# Patient Record
Sex: Female | Born: 1980 | Race: Black or African American | Hispanic: No | Marital: Married | Smoking: Never smoker
Health system: Southern US, Community
[De-identification: ages and names within clinical notes are randomized; demographics above are authoritative.]

## PROBLEM LIST (undated history)

## (undated) DIAGNOSIS — E669 Obesity, unspecified: Secondary | ICD-10-CM

## (undated) DIAGNOSIS — I10 Essential (primary) hypertension: Secondary | ICD-10-CM

---

## 2006-06-01 ENCOUNTER — Emergency Department: Payer: Self-pay

## 2009-11-24 ENCOUNTER — Emergency Department: Payer: Self-pay | Admitting: Emergency Medicine

## 2011-03-16 DIAGNOSIS — M722 Plantar fascial fibromatosis: Secondary | ICD-10-CM | POA: Insufficient documentation

## 2011-03-16 DIAGNOSIS — I10 Essential (primary) hypertension: Secondary | ICD-10-CM | POA: Insufficient documentation

## 2011-04-11 ENCOUNTER — Ambulatory Visit: Payer: Self-pay | Admitting: Family Medicine

## 2011-08-19 ENCOUNTER — Emergency Department: Payer: Self-pay | Admitting: Unknown Physician Specialty

## 2011-08-19 LAB — CBC WITH DIFFERENTIAL/PLATELET
Basophil #: 0 10*3/uL (ref 0.0–0.1)
Basophil %: 0.4 %
Eosinophil %: 0.2 %
HCT: 39.7 % (ref 35.0–47.0)
HGB: 13.2 g/dL (ref 12.0–16.0)
Lymphocyte #: 2.1 10*3/uL (ref 1.0–3.6)
Lymphocyte %: 23.9 %
Monocyte %: 5 %
Neutrophil %: 70.5 %
RDW: 15 % — ABNORMAL HIGH (ref 11.5–14.5)
WBC: 8.7 10*3/uL (ref 3.6–11.0)

## 2011-08-19 LAB — URINALYSIS, COMPLETE
Nitrite: NEGATIVE
Protein: 30
Specific Gravity: 1.03 (ref 1.003–1.030)

## 2011-08-20 LAB — PREGNANCY, URINE: Pregnancy Test, Urine: NEGATIVE m[IU]/mL

## 2012-12-07 DIAGNOSIS — Z6841 Body Mass Index (BMI) 40.0 and over, adult: Secondary | ICD-10-CM | POA: Insufficient documentation

## 2015-05-06 ENCOUNTER — Ambulatory Visit: Admission: EM | Admit: 2015-05-06 | Discharge: 2015-05-06 | Discharge status: Absent without leave | Payer: Self-pay

## 2015-08-13 ENCOUNTER — Ambulatory Visit
Admission: EM | Admit: 2015-08-13 | Discharge: 2015-08-13 | Disposition: A | Discharge status: Home / Self Care | Payer: Federal, State, Local not specified - PPO | Attending: Family Medicine | Admitting: Family Medicine

## 2015-08-13 DIAGNOSIS — H109 Unspecified conjunctivitis: Secondary | ICD-10-CM

## 2015-08-13 HISTORY — DX: Obesity, unspecified: E66.9

## 2015-08-13 MED ORDER — MOXIFLOXACIN HCL 0.5 % OP SOLN: 1.0000 [drp] | Freq: Three times a day (TID) | OPHTHALMIC | Status: AC

## 2015-08-13 NOTE — ED Provider Notes (Signed)
CSN: [id number]     Arrival date & time 08/13/15  1443 History   First MD Initiated Contact with Patient 08/13/15 1614     Chief Complaint  Patient presents with  . Eye Problem   (Consider location/radiation/quality/duration/timing/severity/associated sxs/prior Treatment) HPI Comments: 34 yo female with a 2 days h/o right eye drainage and redness. This morning woke up with more drainage and eyes crusted shut. Denies any trauma, injury, foreign body in eye, vision changes, fevers. Does not wear contacts.   The history is provided by the patient.    Past Medical History  Diagnosis Date  . Obesity    History reviewed. No pertinent past surgical history. Family History  Problem Relation Age of Onset  . Diabetes Father    Social History  Substance Use Topics  . Smoking status: Never Smoker   . Smokeless tobacco: None  . Alcohol Use: No   OB History    No data available     Review of Systems  Allergies  Review of patient's allergies indicates no known allergies.  Home Medications   Prior to Admission medications   Medication Sig Start Date End Date Taking? Authorizing Provider  moxifloxacin (VIGAMOX) 0.5 % ophthalmic solution Place 1 drop into the right eye 3 (three) times daily. For 5 days 08/13/15   Payton Mccallumrlando Cordarrel Stiefel, MD   Meds Ordered and Administered this Visit  Medications - No data to display  BP 127/85 mmHg  Pulse 90  Temp(Src) 97.9 F (36.6 C) (Oral)  Resp 16  Ht 5\' 7"  (1.702 m)  Wt 330 lb (149.687 kg)  BMI 51.67 kg/m2  SpO2 100%  LMP 08/06/2015 (Approximate) No data found.   Physical Exam  Constitutional: She appears well-developed and well-nourished. No distress.  Eyes: EOM are normal. Pupils are equal, round, and reactive to light. Right eye exhibits discharge (mild drainage). Left eye exhibits no discharge. Right conjunctiva is injected. Right conjunctiva has no hemorrhage. Left conjunctiva is not injected. Left conjunctiva has no hemorrhage. No scleral  icterus.  Skin: She is not diaphoretic.  Nursing note and vitals reviewed.   ED Course  Procedures (including critical care time)  Labs Review Labs Reviewed - No data to display  Imaging Review No results found.   Visual Acuity Review  Right Eye Distance: 20/25 corrected Left Eye Distance: 20/25 corrected Bilateral Distance:    Right Eye Near:   Left Eye Near:    Bilateral Near:         MDM   1. Conjunctivitis, right eye    Discharge Medication List as of 08/13/2015  4:26 PM    START taking these medications   Details  moxifloxacin (VIGAMOX) 0.5 % ophthalmic solution Place 1 drop into the right eye 3 (three) times daily. For 5 days, Starting 08/13/2015, Until Discontinued, Normal       1. diagnosis reviewed with patient 2. rx as per orders above; reviewed possible side effects, interactions, risks and benefits  3. Recommend supportive treatment with cool, moist compresses to right eye 4. Follow-up prn if symptoms worsen or don't improve    Payton Mccallumrlando Saniyya Gau, MD 08/13/15 1630

## 2015-08-13 NOTE — ED Notes (Signed)
Started yesterday with itching red eye. Crusted shut this morning

## 2016-06-12 ENCOUNTER — Ambulatory Visit
Admission: EM | Admit: 2016-06-12 | Discharge: 2016-06-12 | Disposition: A | Discharge status: Home / Self Care | Payer: Federal, State, Local not specified - PPO | Attending: Family Medicine | Admitting: Family Medicine

## 2016-06-12 ENCOUNTER — Ambulatory Visit (INDEPENDENT_AMBULATORY_CARE_PROVIDER_SITE_OTHER): Payer: Federal, State, Local not specified - PPO

## 2016-06-12 DIAGNOSIS — M25561 Pain in right knee: Secondary | ICD-10-CM

## 2016-06-12 DIAGNOSIS — S8992XA Unspecified injury of left lower leg, initial encounter: Secondary | ICD-10-CM

## 2016-06-12 HISTORY — DX: Essential (primary) hypertension: I10

## 2016-06-12 MED ORDER — OXYCODONE-ACETAMINOPHEN 5-325 MG PO TABS: 1.0000 | ORAL_TABLET | Freq: Three times a day (TID) | ORAL | 0 refills | Status: AC | PRN

## 2016-06-12 MED ORDER — KETOROLAC TROMETHAMINE 60 MG/2ML IM SOLN
60.0000 mg | Freq: Once | INTRAMUSCULAR | Status: AC
  Administered 2016-06-12: 60 mg via INTRAMUSCULAR

## 2016-06-12 MED ORDER — MELOXICAM 15 MG PO TABS: 15.0000 mg | ORAL_TABLET | Freq: Every day | ORAL | 0 refills | Status: DC

## 2016-06-12 NOTE — ED Provider Notes (Signed)
[facility]    CSN: [id number] Arrival date & time: 06/12/16  1437     History   Chief Complaint Chief Complaint  Patient presents with  . Knee Pain    right knee throbbing pain hard ROM onset yesterday pt has hx of knee pain and ankle pain 6 month to year but getting worst yesterday    HPI Guinevere ScarletJerica M Tyer is a 35 y.o. female.   Patient is a 35 year old black female. States that she's on her knees and feet all day long and she has knee and feet problems anyway because of her weight but yesterday while she was at school started having specific pain in her right knee. She denies any injury a twisting but noticed the discomfort continued to get worse in the right knee. She did not feel a pop nor anything hit the knee. She's never had any diagnostic tests done on her. She has a history of hypertension and obesity. She does not smoke and no known drug allergies. Mother and father have diabetes. No previous surgeries or operations   The history is provided by the patient. No language interpreter was used.  Knee Pain  Location:  Knee Injury: no   Knee location:  R knee Pain details:    Quality:  Tearing and shooting   Severity:  Severe   Onset quality:  Sudden   Timing:  Constant   Progression:  Unchanged Chronicity:  New Dislocation: no   Foreign body present:  No foreign bodies Relieved by:  Nothing Worsened by:  Nothing Ineffective treatments:  None tried   Past Medical History:  Diagnosis Date  . Hypertension   . Obesity     There are no active problems to display for this patient.   History reviewed. No pertinent surgical history.  OB History    No data available       Home Medications    Prior to Admission medications   Medication Sig Start Date End Date Taking? Authorizing Provider  moxifloxacin (VIGAMOX) 0.5 % ophthalmic solution Place 1 drop into the right eye 3 (three) times daily. For 5 days 08/13/15   Payton Mccallumrlando Conty, MD    oxyCODONE-acetaminophen (PERCOCET/ROXICET) 5-325 MG tablet Take 1 tablet by mouth every 8 (eight) hours as needed for moderate pain or severe pain. May cause sedation recommend mainly taking at night 06/12/16   Hassan RowanEugene Emmelia Holdsworth, MD    Family History Family History  Problem Relation Age of Onset  . Diabetes Father     Social History Social History  Substance Use Topics  . Smoking status: Never Smoker  . Smokeless tobacco: Never Used  . Alcohol use No     Allergies   Review of patient's allergies indicates no known allergies.   Review of Systems Review of Systems  Musculoskeletal: Positive for gait problem and joint swelling.  All other systems reviewed and are negative.    Physical Exam Triage Vital Signs ED Triage Vitals  Enc Vitals Group     BP 06/12/16 1628 (!) 146/67     Pulse Rate 06/12/16 1628 89     Resp 06/12/16 1628 16     Temp 06/12/16 1628 98.8 F (37.1 C)     Temp Source 06/12/16 1628 Oral     SpO2 06/12/16 1628 100 %     Weight 06/12/16 1631 (!) 320 lb (145.2 kg)     Height 06/12/16 1631 5\' 8"  (1.727 m)     Head Circumference --  Peak Flow --      Pain Score 06/12/16 1633 9     Pain Loc --      Pain Edu? --      Excl. in GC? --    No data found.   Updated Vital Signs BP (!) 146/67 (BP Location: Right Arm)   Pulse 89   Temp 98.8 F (37.1 C) (Oral)   Resp 16   Ht 5\' 8"  (1.727 m)   Wt (!) 320 lb (145.2 kg)   SpO2 100%   BMI 48.66 kg/m   Visual Acuity Right Eye Distance:   Left Eye Distance:   Bilateral Distance:    Right Eye Near:   Left Eye Near:    Bilateral Near:     Physical Exam  Constitutional: She is oriented to person, place, and time. Vital signs are normal. She appears well-developed and well-nourished. She is active.  Obese black female  HENT:  Head: Normocephalic.  Eyes: Conjunctivae are normal. Pupils are equal, round, and reactive to light.  Neck: Normal range of motion. Neck supple.  Pulmonary/Chest: Effort  normal.  Musculoskeletal: She exhibits edema and tenderness.       Right knee: She exhibits swelling and effusion. Tenderness found. Patellar tendon tenderness noted.       Legs: Patient is tender over the lateral upper right knee also tender over the patella area and along the medial superior aspect patella. This also significant amount of swelling in the right knee as well With the [facility] joint laxity though of the knee at this time.  Neurological: She is alert and oriented to person, place, and time. She has normal reflexes.  Skin: Skin is warm and dry.  Psychiatric: She has a normal mood and affect.  Vitals reviewed.    UC Treatments / Results  Labs (all labs ordered are listed, but only abnormal results are displayed) Labs Reviewed - No data to display  EKG  EKG Interpretation None       Radiology Dg Knee Complete 4 Views Right  Result Date: 06/12/2016 CLINICAL DATA:  Right knee pain for 1 day.  Unable to bear weight. EXAM: RIGHT KNEE - COMPLETE 4+ VIEW COMPARISON:  None. FINDINGS: Curvilinear osseous fragment at the upper margin of the medial femoral condyle suggesting avulsion of the medial femoral collateral ligament (Pellegrini-Stieda lesion). Osseous structures appear otherwise intact and normally aligned. Probable small joint effusion within the suprapatellar bursa. IMPRESSION: Curvilinear osseous fragment at the upper margin of the medial femoral condyle, suggesting avulsion of the medial femoral collateral ligament, of uncertain age. Electronically Signed   By: Bary RichardStan  Maynard M.D.   On: 06/12/2016 18:05    Procedures Procedures (including critical care time)  Medications Ordered in UC Medications  ketorolac (TORADOL) injection 60 mg (60 mg Intramuscular Given 06/12/16 1726)     Initial Impression / Assessment and Plan / UC Course  I have reviewed the triage vital signs and the nursing notes.  Pertinent labs & imaging results that were available during my care of  the patient were reviewed by me and considered in my medical decision making (see chart for details).  Clinical Course    Patient was given 60 Toradol for pain. X-ray did come back showing what appears be avulsion medial collateral ligament of the knee. Is also systems question of fluid on the knee as well.    Final Clinical Impressions(s) / UC Diagnoses   Final diagnoses:  Injury of ligament of left knee, initial encounter  Acute pain of right knee    New Prescriptions New Prescriptions   OXYCODONE-ACETAMINOPHEN (PERCOCET/ROXICET) 5-325 MG TABLET    Take 1 tablet by mouth every 8 (eight) hours as needed for moderate pain or severe pain. May cause sedation recommend mainly taking at night    Patient is brought in by her doctors are in [place] will recommend trying orthopedic and they do have an office here in [place] with PCP [facility] orthopedic will give her that phone number. She has trouble getting hold of both orthopedic on Monday to please call back. Work note given for Monday and Tuesday. Patient given a knee immobilizer and crutches. Also add a prescription of Percocet and Mobic as well.      Hassan Rowan, MD 06/12/16 [phone]

## 2016-06-25 DIAGNOSIS — M222X9 Patellofemoral disorders, unspecified knee: Secondary | ICD-10-CM | POA: Insufficient documentation

## 2016-06-25 DIAGNOSIS — M25569 Pain in unspecified knee: Secondary | ICD-10-CM | POA: Insufficient documentation

## 2017-10-14 DIAGNOSIS — Z803 Family history of malignant neoplasm of breast: Secondary | ICD-10-CM | POA: Insufficient documentation

## 2018-02-11 IMAGING — CR DG KNEE COMPLETE 4+V*R*
4 series · 4 of 4 positions shown · non-contrast
Comparison: None.

CLINICAL DATA: Right knee pain for 1 day.  Unable to bear weight.

EXAM:
RIGHT KNEE - COMPLETE 4+ VIEW

[knee ap (1 of 3)]
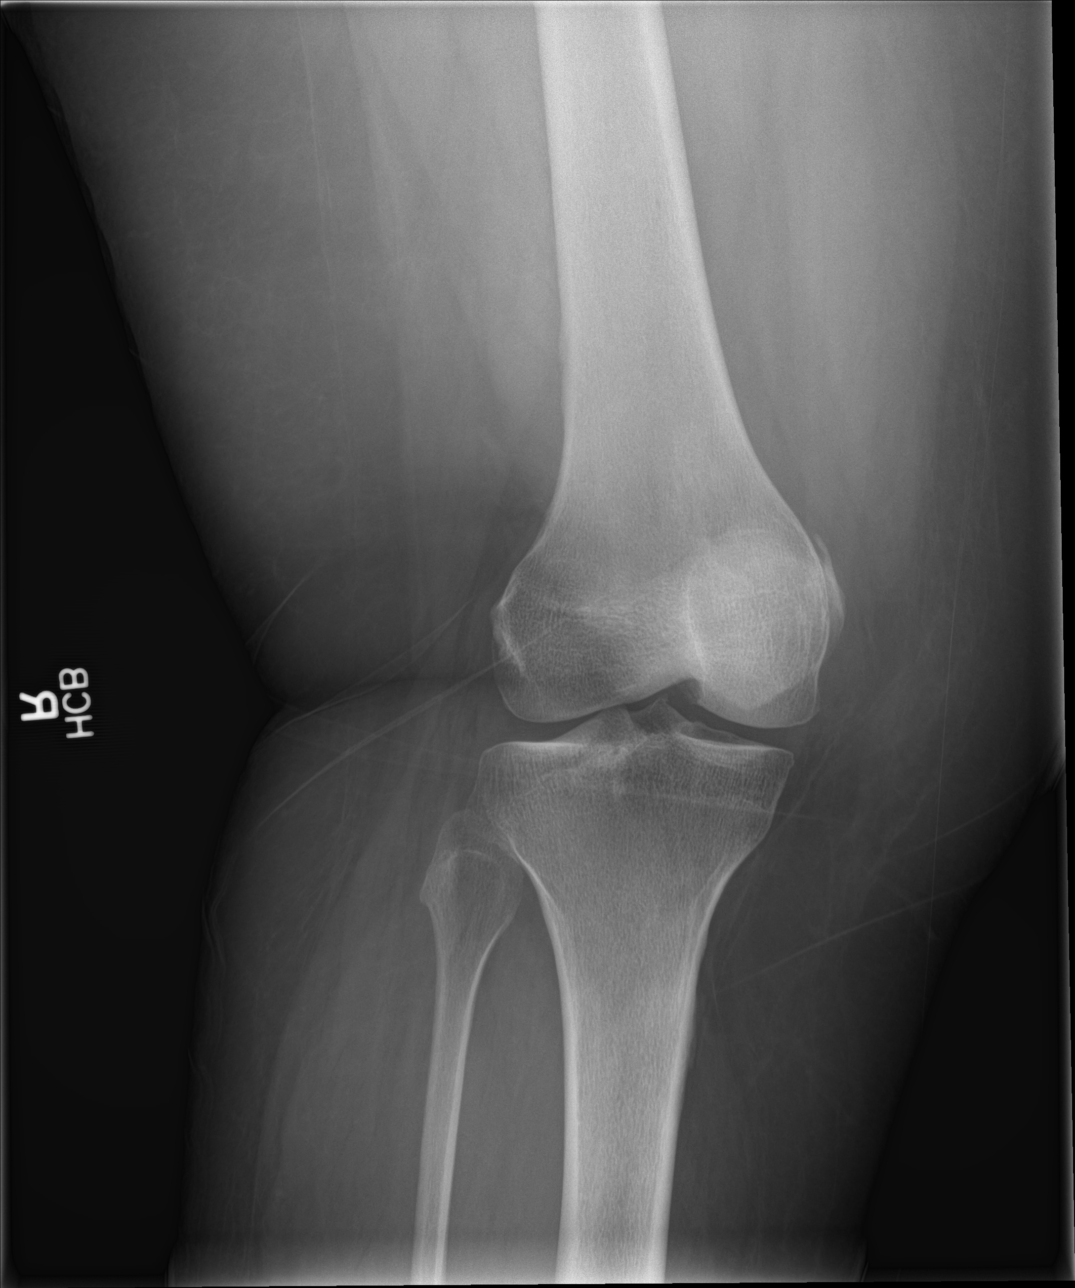

[knee lat]
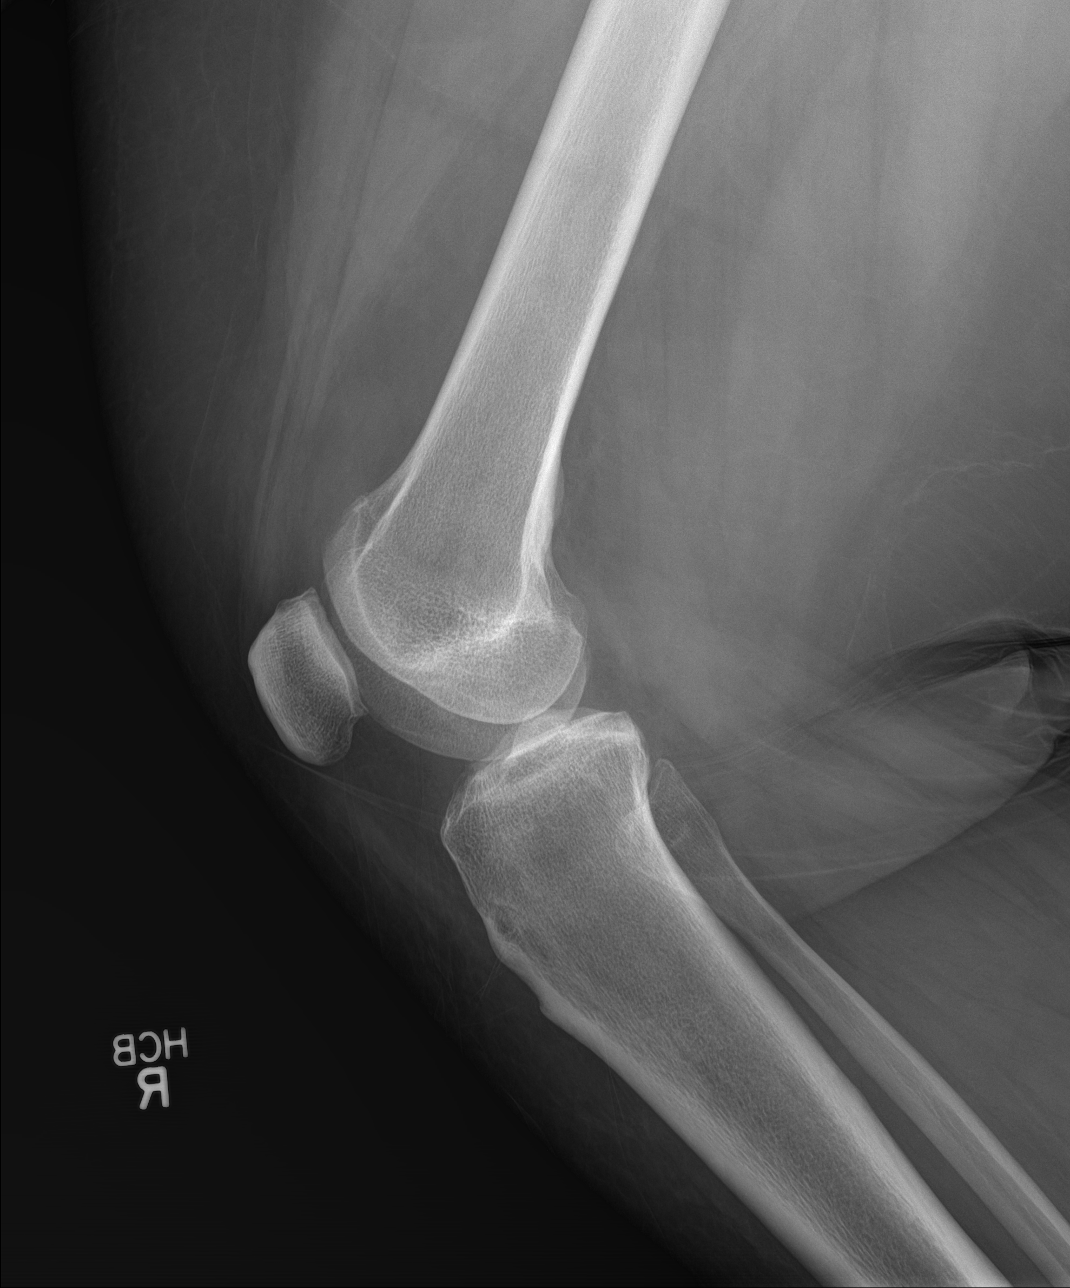

[knee ap (2 of 3)]
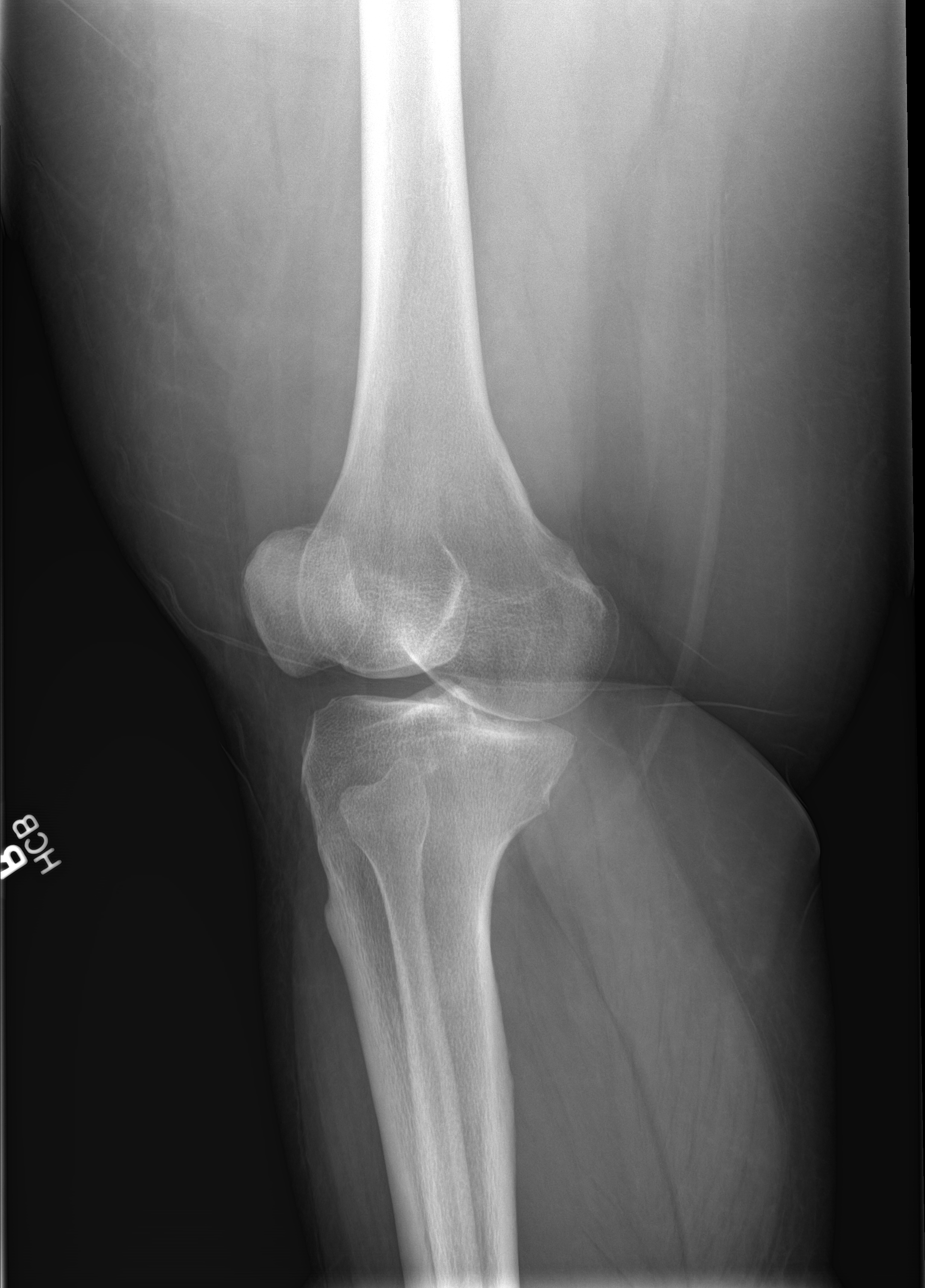

[knee ap (3 of 3)]
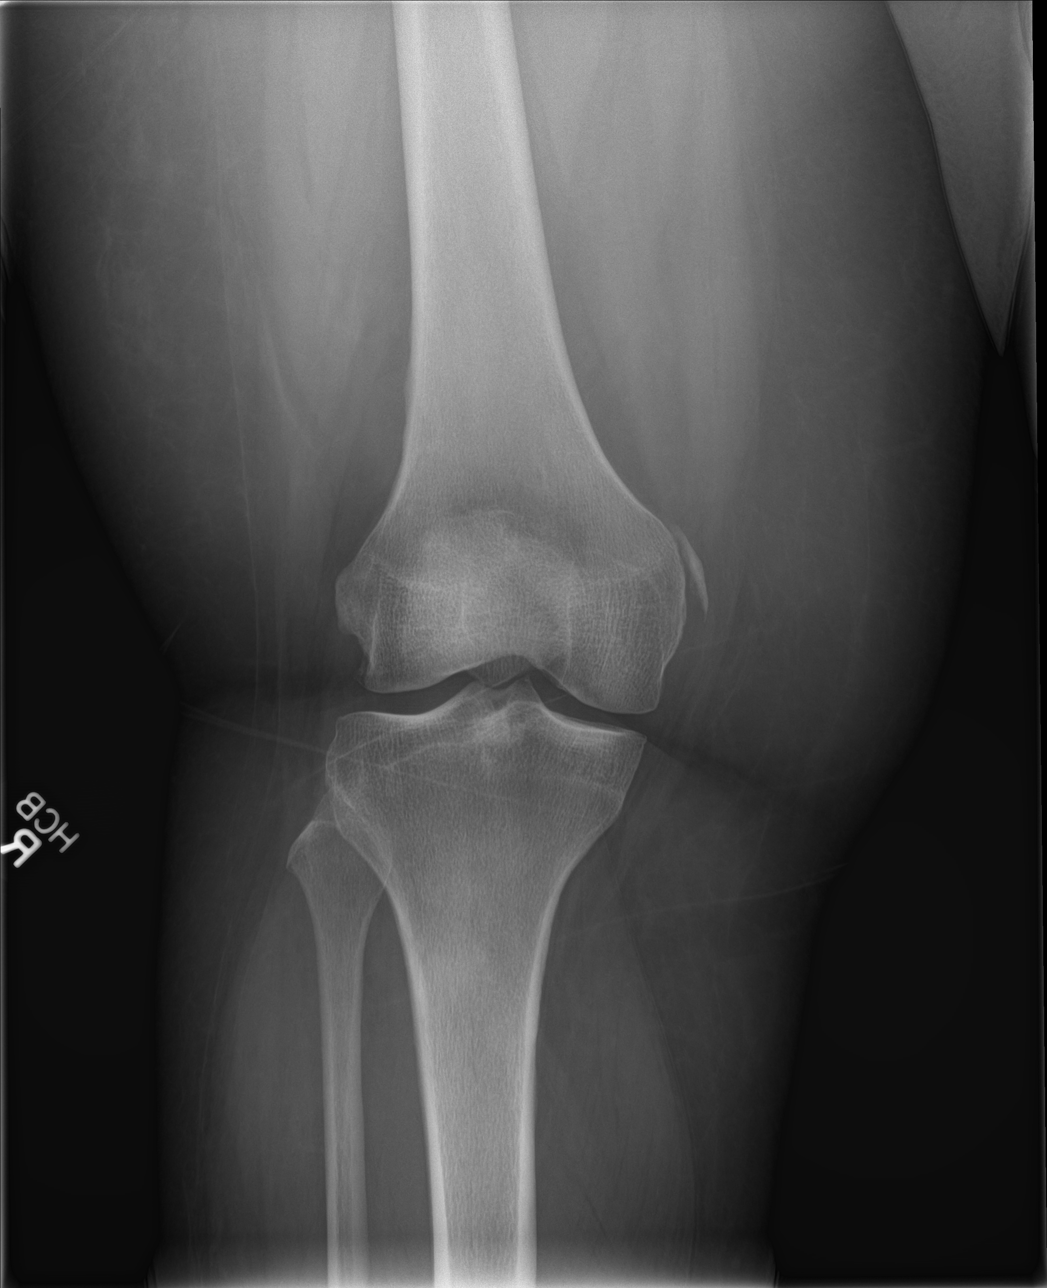

[4 of 4 positions shown; findings below may reference images not displayed]

FINDINGS: Curvilinear osseous fragment at the upper margin of the medial
femoral condyle suggesting avulsion of the medial femoral collateral
ligament (Pellegrini-Stieda lesion). Osseous structures appear
otherwise intact and normally aligned. Probable small joint effusion
within the suprapatellar bursa.
IMPRESSION: Curvilinear osseous fragment at the upper margin of the medial
femoral condyle, suggesting avulsion of the medial femoral
collateral ligament, of uncertain age.

## 2020-02-28 DIAGNOSIS — R7303 Prediabetes: Secondary | ICD-10-CM | POA: Insufficient documentation

## 2020-02-28 DIAGNOSIS — M7989 Other specified soft tissue disorders: Secondary | ICD-10-CM | POA: Insufficient documentation

## 2020-05-09 DIAGNOSIS — Z23 Encounter for immunization: Secondary | ICD-10-CM | POA: Insufficient documentation

## 2020-05-09 DIAGNOSIS — I890 Lymphedema, not elsewhere classified: Secondary | ICD-10-CM | POA: Insufficient documentation

## 2020-11-06 DIAGNOSIS — E876 Hypokalemia: Secondary | ICD-10-CM | POA: Insufficient documentation

## 2021-02-21 DIAGNOSIS — R109 Unspecified abdominal pain: Secondary | ICD-10-CM | POA: Insufficient documentation

## 2021-06-25 DIAGNOSIS — M79644 Pain in right finger(s): Secondary | ICD-10-CM | POA: Insufficient documentation

## 2021-12-10 ENCOUNTER — Ambulatory Visit
Admission: EM | Admit: 2021-12-10 | Discharge: 2021-12-10 | Disposition: A | Discharge status: Home / Self Care | Payer: Federal, State, Local not specified - PPO

## 2021-12-10 DIAGNOSIS — M7661 Achilles tendinitis, right leg: Secondary | ICD-10-CM | POA: Diagnosis not present

## 2021-12-10 DIAGNOSIS — M7662 Achilles tendinitis, left leg: Secondary | ICD-10-CM

## 2021-12-10 MED ORDER — MELOXICAM 7.5 MG PO TABS: 7.5000 mg | ORAL_TABLET | Freq: Every day | ORAL | 0 refills | Status: AC

## 2021-12-10 NOTE — ED Triage Notes (Signed)
Patient is here for "Right & Left ankle pain". Started "a few days ago". No injury known. "Walking and on my feet hurts more and more". Some swelling noticed.  ?

## 2021-12-10 NOTE — Discharge Instructions (Addendum)
Wear the walker boot during the day ?And the ankle immobilizer for the R side.  ?Apply heat to areas for 15-20 minutes and do stretches I showed you after that. Do this about three times a day.  ?

## 2021-12-10 NOTE — ED Provider Notes (Signed)
?[facility] ? ? ? ?CSN: [id number] ?Arrival date & time: 12/10/21  1021 ? ? ?  ? ?History   ?Chief Complaint ?Chief Complaint  ?Patient presents with  ? Ankle Pain  ? ? ?HPI ?Meagan Griffin is a 41 y.o. female who presents with bilateral achillis pain x 3 days. Has hx of same problem about 2 years ago of L side, and ended up seeing a podiatrist, getting steroid shot for her L achillis and was placed on a walker boot and ankle/foot brace for bed time for one month after that and she fully healed. 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  ?States she has not taken her BP med today.  ? ? ? ?Past Medical History:  ?Diagnosis Date  ? Hypertension   ? Obesity   ? ? ?Patient Active Problem List  ? Diagnosis Date Noted  ? Pain of right thumb 06/25/2021  ? Right flank pain 02/21/2021  ? Hypokalemia 11/06/2020  ? Encounter for immunization 05/09/2020  ? Lymphedema of both lower extremities 05/09/2020  ? Prediabetes 02/28/2020  ? Swelling of both lower extremities 02/28/2020  ? FH: breast cancer in first degree relative 10/14/2017  ? Knee pain 06/25/2016  ? Patellofemoral stress syndrome 06/25/2016  ? BMI 50.0-59.9, adult (HCC) 12/07/2012  ? Essential hypertension 03/16/2011  ? Plantar fasciitis 03/16/2011  ? ? ?History reviewed. No pertinent surgical history. ? ?OB History   ?No obstetric history on file. ?  ? ? ? ?Home Medications   ? ?Prior to Admission medications   ?Medication Sig Start Date End Date Taking? Authorizing Provider  ?albuterol (VENTOLIN HFA) 108 (90 Base) MCG/ACT inhaler Inhale into the lungs. 12/13/18  Yes [provider]  ?amLODipine (NORVASC) 5 MG tablet Take by mouth. 06/25/21  Yes [provider]  ?diclofenac Sodium (VOLTAREN) 1 % GEL Apply topically. 02/20/21 02/20/22 Yes [provider]  ?hydrochlorothiazide (HYDRODIURIL) 25 MG tablet Take 1 tablet by mouth daily. 06/25/21 06/25/22 Yes [provider]  ?losartan (COZAAR) 50 MG tablet Take 1  tablet by mouth daily. 06/25/21 06/25/22 Yes [provider]  ?meloxicam (MOBIC) 7.5 MG tablet Take 1 tablet (7.5 mg total) by mouth daily. 12/10/21  Yes Rodriguez-Southworth, Nettie Elm, PA-C  ?moxifloxacin (VIGAMOX) 0.5 % ophthalmic solution Place 1 drop into the right eye 3 (three) times daily. For 5 days 08/13/15   Payton Mccallum, MD  ?oxyCODONE-acetaminophen (PERCOCET/ROXICET) 5-325 MG tablet Take 1 tablet by mouth every 8 (eight) hours as needed for moderate pain or severe pain. May cause sedation recommend mainly taking at night 06/12/16   Hassan Rowan, MD  ? ? ?Family History ?Family History  ?Problem Relation Age of Onset  ? Diabetes Father   ? ? ?Social History ?Social History  ? ?Tobacco Use  ? Smoking status: Never  ? Smokeless tobacco: Never  ?Vaping Use  ? Vaping Use: Never used  ?Substance Use Topics  ? Alcohol use: No  ? Drug use: No  ? ? ? ?Allergies   ?Oxycodone-acetaminophen ? ? ?Review of Systems ?Review of Systems ?+Posterior lower leg pain bilaterally and mild swelling, neg numbness or joint swelling.  ? ?Physical Exam ?Triage Vital Signs ?ED Triage Vitals  ?Enc Vitals Group  ?   BP 12/10/21 1206 (!) 134/92  ?   Pulse Rate 12/10/21 1206 90  ?   Resp 12/10/21 1206 (!) 22  ?   Temp 12/10/21 1206 98.2 ?F (36.8 ?C)  ?   Temp  Source 12/10/21 1206 Oral  ?   SpO2 12/10/21 1206 100 %  ?   Weight 12/10/21 1204 (!) 320 lb 1.7 oz (145.2 kg)  ?   Height 12/10/21 1204 5\' 6"  (1.676 m)  ?   Head Circumference --   ?   Peak Flow --   ?   Pain Score 12/10/21 1202 10  ?   Pain Loc --   ?   Pain Edu? --   ?   Excl. in GC? --   ? ?No data found. ? ?Updated Vital Signs ?BP (!) 134/92 (BP Location: Left Arm) Comment: Attempts x2  Pulse 90   Temp 98.2 ?F (36.8 ?C) (Oral)   Resp (!) 22   Ht 5\' 6"  (1.676 m)   Wt (!) 320 lb 1.7 oz (145.2 kg)   LMP 11/26/2021   SpO2 100%   BMI 51.67 kg/m?  ? ?Visual Acuity ?Right Eye Distance:   ?Left Eye Distance:   ?Bilateral Distance:   ? ?Right Eye Near:   ?Left Eye Near:     ?Bilateral Near:    ? ?Physical Exam ?Constitutional:   ?   Appearance: She is obese.  ?HENT:  ?   Right Ear: External ear normal.  ?   Left Ear: External ear normal.  ?Eyes:  ?   General: No scleral icterus. ?   Conjunctiva/sclera: Conjunctivae normal.  ?Cardiovascular:  ?   Pulses: Normal pulses.  ?Pulmonary:  ?   Effort: Pulmonary effort is normal.  ?   Comments: Respirations 16/min ?Musculoskeletal:     ?   General: Normal range of motion.  ?   Cervical back: Neck supple.  ?   Right lower leg: No edema.  ?   Left lower leg: No edema.  ?   Comments: Both achillis tendons are tender to palpation. No nodularities or defects noted on the achillis. Thompson test negative bilaterally  ?Skin: ?   General: Skin is warm and dry.  ?   Findings: No rash.  ?Neurological:  ?   Mental Status: She is alert and oriented to person, place, and time.  ?   Gait: Gait normal.  ?Psychiatric:     ?   Mood and Affect: Mood normal.     ?   Behavior: Behavior normal.     ?   Thought Content: Thought content normal.     ?   Judgment: Judgment normal.  ? ? ? ?UC Treatments / Results  ?Labs ?(all labs ordered are listed, but only abnormal results are displayed) ?Labs Reviewed - No data to display ? ?EKG ? ? ?Radiology ?No results found. ? ?Procedures ?Procedures (including critical care time) ? ?Medications Ordered in UC ?Medications - No data to display ? ?Initial Impression / Assessment and Plan / UC Course  ?I have reviewed the triage vital signs and the nursing notes. ? ?Bilateral Achillis tendonitis ? ?Placed on Mobic as noted, and given a walker boot for R leg and air cast of R leg. She has the L leg at home. She is to wear them both this for 3 days. I taught her stretches to do. Needs to Fu with with podiatrist. See instructions.  ? ? ? ?Final Clinical Impressions(s) / UC Diagnoses  ? ?Final diagnoses:  ?Achilles tendonitis, bilateral  ? ? ? ?Discharge Instructions   ? ?  ?Wear the walker boot during the day ?And the ankle  immobilizer for the R side.  ?Apply heat to areas for 15-20  minutes and do stretches I showed you after that. Do this about three times a day.  ? ? ? ? ?ED Prescriptions   ? ? Medication Sig Dispense Auth. Provider  ? meloxicam (MOBIC) 7.5 MG tablet Take 1 tablet (7.5 mg total) by mouth daily. 14 tablet Rodriguez-Southworth, Nettie ElmSylvia, PA-C  ? ?  ? ?PDMP not reviewed this encounter. ?  ?Garey HamRodriguez-Southworth, Lizzett Nobile, PA-C ?12/10/21 1602 ? ?
